# Patient Record
Sex: Female | Born: 1945 | Race: Black or African American | Hispanic: No | Marital: Married | Smoking: Never smoker
Health system: Southern US, Community
[De-identification: ages and names within clinical notes are randomized; demographics above are authoritative.]

## PROBLEM LIST (undated history)

## (undated) DIAGNOSIS — I10 Essential (primary) hypertension: Secondary | ICD-10-CM

## (undated) HISTORY — DX: Essential (primary) hypertension: I10

---

## 2015-09-16 DIAGNOSIS — I10 Essential (primary) hypertension: Secondary | ICD-10-CM | POA: Insufficient documentation

## 2015-09-16 DIAGNOSIS — H269 Unspecified cataract: Secondary | ICD-10-CM | POA: Insufficient documentation

## 2015-09-16 DIAGNOSIS — Z87820 Personal history of traumatic brain injury: Secondary | ICD-10-CM | POA: Insufficient documentation

## 2015-10-19 DIAGNOSIS — R55 Syncope and collapse: Secondary | ICD-10-CM | POA: Insufficient documentation

## 2018-04-02 DIAGNOSIS — F0280 Dementia in other diseases classified elsewhere without behavioral disturbance: Secondary | ICD-10-CM | POA: Insufficient documentation

## 2020-08-02 ENCOUNTER — Encounter: Payer: Self-pay | Admitting: Physician Assistant
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
## 2020-08-06 ENCOUNTER — Ambulatory Visit (INDEPENDENT_AMBULATORY_CARE_PROVIDER_SITE_OTHER): Payer: Medicare Other | Admitting: Physician Assistant

## 2020-08-06 ENCOUNTER — Other Ambulatory Visit: Payer: Self-pay

## 2020-08-06 ENCOUNTER — Encounter: Payer: Self-pay | Admitting: Physician Assistant

## 2020-08-06 VITALS — BP 148/82 | HR 62 | Ht 65.0 in | Wt 182.6 lb

## 2020-08-06 DIAGNOSIS — R413 Other amnesia: Secondary | ICD-10-CM

## 2020-08-06 NOTE — Patient Instructions (Addendum)
It was a pleasure to see you today at our office.   Recommendations:  Meds:  MRI brain  Check B12 and TSH   Continue Memantine 10 mg twice daily.  Continue donepezil 10 mg daily.   Physical therapy referral was done  RECOMMENDATIONS FOR ALL PATIENTS WITH MEMORY PROBLEMS: 1. Continue to exercise (Recommend 30 minutes of walking everyday, or 3 hours every week) 2. Increase social interactions - continue going to [place] and enjoy social gatherings with friends and family 3. Eat healthy, avoid fried foods and eat more fruits and vegetables 4. Maintain adequate blood pressure, blood sugar, and blood cholesterol level. Reducing the risk of stroke and cardiovascular disease also helps promoting better memory. 5. Avoid stressful situations. Live a simple life and avoid aggravations. Organize your time and prepare for the next day in anticipation. 6. Sleep well, avoid any interruptions of sleep and avoid any distractions in the bedroom that may interfere with adequate sleep quality 7. Avoid sugar, avoid sweets as there is a strong link between excessive sugar intake, diabetes, and cognitive impairment We discussed the Mediterranean diet, which has been shown to help patients reduce the risk of progressive memory disorders and reduces cardiovascular risk. This includes eating fish, eat fruits and green leafy vegetables, nuts like almonds and hazelnuts, walnuts, and also use olive oil. Avoid fast foods and fried foods as much as possible. Avoid sweets and sugar as sugar use has been linked to worsening of memory function.  There is always a concern of gradual progression of memory problems. If this is the case, then we may need to adjust level of care according to patient needs. Support, both to the patient and caregiver, should then be put into place.    The Alzheimer's Association is here all day, every day for people facing Alzheimer's disease through our free 24/7 Helpline: [phone]. The  Helpline provides reliable information and support to all those who need assistance, such as individuals living with memory loss, Alzheimer's or other dementia, caregivers, health care professionals and the public.  Our highly trained and knowledgeable staff can help you with: Understanding memory loss, dementia and Alzheimer's  Medications and other treatment options  General information about aging and brain health  Skills to provide quality care and to find the best care from professionals  Legal, financial and living-arrangement decisions Our Helpline also features: Confidential care consultation provided by master's level clinicians who can help with decision-making support, crisis assistance and education on issues families face every day  Help in a caller's preferred language using our translation service that features more than 200 languages and dialects  Referrals to local community programs, services and ongoing support     FALL PRECAUTIONS: Be cautious when walking. Scan the area for obstacles that may increase the risk of trips and falls. When getting up in the mornings, sit up at the edge of the bed for a few minutes before getting out of bed. Consider elevating the bed at the head end to avoid drop of blood pressure when getting up. Walk always in a well-lit room (use night lights in the walls). Avoid area rugs or power cords from appliances in the middle of the walkways. Use a walker or a cane if necessary and consider physical therapy for balance exercise. Get your eyesight checked regularly.  FINANCIAL OVERSIGHT: Supervision, especially oversight when making financial decisions or transactions is also recommended.  HOME SAFETY: Consider the safety of the kitchen when operating appliances like stoves, microwave oven,  and blender. Consider having supervision and share cooking responsibilities until no longer able to participate in those. Accidents with firearms and other hazards in  the house should be identified and addressed as well.   ABILITY TO BE LEFT ALONE: If patient is unable to contact 911 operator, consider using LifeLine, or when the need is there, arrange for someone to stay with patients. Smoking is a fire hazard, consider supervision or cessation. Risk of wandering should be assessed by caregiver and if detected at any point, supervision and safe proof recommendations should be instituted.  MEDICATION SUPERVISION: Inability to self-administer medication needs to be constantly addressed. Implement a mechanism to ensure safe administration of the medications.   DRIVING: Regarding driving, in patients with progressive memory problems, driving will be impaired. We advise to have someone else do the driving if trouble finding directions or if minor accidents are reported. Independent driving assessment is available to determine safety of driving.   If you are interested in the driving assessment, you can contact the following:  [employer] in [place] [phone]  [facility] [phone]  [facility] [phone] or [phone]      Mediterranean Diet A Mediterranean diet refers to food and lifestyle choices that are based on the traditions of countries located on the [employer]. This way of eating has been shown to help prevent certain conditions and improve outcomes for people who have chronic diseases, like kidney disease and heart disease. What are tips for following this plan? Lifestyle  Cook and eat meals together with your family, when possible. Drink enough fluid to keep your urine clear or pale yellow. Be physically active every day. This includes: Aerobic exercise like running or swimming. Leisure activities like gardening, walking, or housework. Get 7-8 hours of sleep each night. If recommended by your health care provider, drink red wine in moderation. This  means 1 glass a day for nonpregnant women and 2 glasses a day for men. A glass of wine equals 5 oz (150 mL). Reading food labels  Check the serving size of packaged foods. For foods such as rice and pasta, the serving size refers to the amount of cooked product, not dry. Check the total fat in packaged foods. Avoid foods that have saturated fat or trans fats. Check the ingredients list for added sugars, such as corn syrup. Shopping  At the grocery store, buy most of your food from the areas near the walls of the store. This includes: Fresh fruits and vegetables (produce). Grains, beans, nuts, and seeds. Some of these may be available in unpackaged forms or large amounts (in bulk). Fresh seafood. Poultry and eggs. Low-fat dairy products. Buy whole ingredients instead of prepackaged foods. Buy fresh fruits and vegetables in-season from local farmers markets. Buy frozen fruits and vegetables in resealable bags. If you do not have access to quality fresh seafood, buy precooked frozen shrimp or canned fish, such as tuna, salmon, or sardines. Buy small amounts of raw or cooked vegetables, salads, or olives from the deli or salad bar at your store. Stock your pantry so you always have certain foods on hand, such as olive oil, canned tuna, canned tomatoes, rice, pasta, and beans. Cooking  Cook foods with extra-virgin olive oil instead of using butter or other vegetable oils. Have meat as a side dish, and have vegetables or grains as your main dish. This means having meat in small portions or adding small amounts of meat to foods  like pasta or stew. Use beans or vegetables instead of meat in common dishes like chili or lasagna. Experiment with different cooking methods. Try roasting or broiling vegetables instead of steaming or sauteing them. Add frozen vegetables to soups, stews, pasta, or rice. Add nuts or seeds for added healthy fat at each meal. You can add these to yogurt, salads, or vegetable  dishes. Marinate fish or vegetables using olive oil, lemon juice, garlic, and fresh herbs. Meal planning  Plan to eat 1 vegetarian meal one day each week. Try to work up to 2 vegetarian meals, if possible. Eat seafood 2 or more times a week. Have healthy snacks readily available, such as: Vegetable sticks with hummus. Greek yogurt. Fruit and nut trail mix. Eat balanced meals throughout the week. This includes: Fruit: 2-3 servings a day Vegetables: 4-5 servings a day Low-fat dairy: 2 servings a day Fish, poultry, or lean meat: 1 serving a day Beans and legumes: 2 or more servings a week Nuts and seeds: 1-2 servings a day Whole grains: 6-8 servings a day Extra-virgin olive oil: 3-4 servings a day Limit red meat and sweets to only a few servings a month What are my food choices? Mediterranean diet Recommended Grains: Whole-grain pasta. Brown rice. Bulgar wheat. Polenta. Couscous. Whole-wheat bread. Orpah Cobb. Vegetables: Artichokes. Beets. Broccoli. Cabbage. Carrots. Eggplant. Green beans. Chard. Kale. Spinach. Onions. Leeks. Peas. Squash. Tomatoes. Peppers. Radishes. Fruits: Apples. Apricots. Avocado. Berries. Bananas. Cherries. Dates. Figs. Grapes. Lemons. Melon. Oranges. Peaches. Plums. Pomegranate. Meats and other protein foods: Beans. Almonds. Sunflower seeds. Pine nuts. Peanuts. Cod. Salmon. Scallops. Shrimp. Tuna. Tilapia. Clams. Oysters. Eggs. Dairy: Low-fat milk. Cheese. Greek yogurt. Beverages: Water. Red wine. Herbal tea. Fats and oils: Extra virgin olive oil. Avocado oil. Grape seed oil. Sweets and desserts: [place] yogurt with honey. Baked apples. Poached pears. Trail mix. Seasoning and other foods: Basil. Cilantro. Coriander. Cumin. Mint. Parsley. Sage. Rosemary. Tarragon. Garlic. Oregano. Thyme. Pepper. Balsalmic vinegar. Tahini. Hummus. Tomato sauce. Olives. Mushrooms. Limit these Grains: Prepackaged pasta or rice dishes. Prepackaged cereal with added  sugar. Vegetables: Deep fried potatoes (french fries). Fruits: Fruit canned in syrup. Meats and other protein foods: Beef. Pork. Lamb. Poultry with skin. Hot dogs. Tomasa Blase. Dairy: Ice cream. Sour cream. Whole milk. Beverages: Juice. Sugar-sweetened soft drinks. Beer. Liquor and spirits. Fats and oils: Butter. Canola oil. Vegetable oil. Beef fat (tallow). Lard. Sweets and desserts: Cookies. Cakes. Pies. Candy. Seasoning and other foods: Mayonnaise. Premade sauces and marinades. The items listed may not be a complete list. Talk with your dietitian about what dietary choices are right for you. Summary The Mediterranean diet includes both food and lifestyle choices. Eat a variety of fresh fruits and vegetables, beans, nuts, seeds, and whole grains. Limit the amount of red meat and sweets that you eat. Talk with your health care provider about whether it is safe for you to drink red wine in moderation. This means 1 glass a day for nonpregnant women and 2 glasses a day for men. A glass of wine equals 5 oz (150 mL). This information is not intended to replace advice given to you by your health care provider. Make sure you discuss any questions you have with your health care provider. Document Released: 08/26/2015 Document Revised: 09/28/2015 Document Reviewed: 08/26/2015 Elsevier Interactive Patient Education  2017 [employer].

## 2020-08-13 ENCOUNTER — Other Ambulatory Visit: Payer: Self-pay

## 2020-08-13 DIAGNOSIS — Z87820 Personal history of traumatic brain injury: Secondary | ICD-10-CM

## 2020-08-13 DIAGNOSIS — R413 Other amnesia: Secondary | ICD-10-CM

## 2020-08-23 ENCOUNTER — Other Ambulatory Visit: Payer: Self-pay

## 2020-08-23 ENCOUNTER — Ambulatory Visit
Admission: RE | Admit: 2020-08-23 | Discharge: 2020-08-23 | Disposition: A | Discharge status: Home / Self Care | Payer: Medicare Other | Source: Ambulatory Visit | Attending: Physician Assistant | Admitting: Physician Assistant

## 2020-08-23 DIAGNOSIS — R413 Other amnesia: Secondary | ICD-10-CM

## 2020-08-23 MED ORDER — GADOBENATE DIMEGLUMINE 529 MG/ML IV SOLN
17.0000 mL | Freq: Once | INTRAVENOUS | Status: AC | PRN
  Administered 2020-08-23: 17 mL via INTRAVENOUS

## 2020-08-24 NOTE — Progress Notes (Signed)
Advised patient and voiced understanding of MRI results.

## 2020-10-06 ENCOUNTER — Other Ambulatory Visit: Payer: Self-pay

## 2020-10-06 ENCOUNTER — Ambulatory Visit: Payer: Medicare HMO | Admitting: Physician Assistant

## 2020-10-06 ENCOUNTER — Telehealth (HOSPITAL_COMMUNITY): Payer: Self-pay

## 2020-10-06 ENCOUNTER — Encounter: Payer: Self-pay | Admitting: Physician Assistant

## 2020-10-06 VITALS — BP 150/82 | HR 99 | Ht 65.0 in | Wt 181.0 lb

## 2020-10-06 DIAGNOSIS — F0391 Unspecified dementia with behavioral disturbance: Secondary | ICD-10-CM | POA: Diagnosis not present

## 2020-10-06 DIAGNOSIS — R413 Other amnesia: Secondary | ICD-10-CM

## 2020-10-06 DIAGNOSIS — Z87820 Personal history of traumatic brain injury: Secondary | ICD-10-CM | POA: Diagnosis not present

## 2020-10-06 DIAGNOSIS — F03B18 Unspecified dementia, moderate, with other behavioral disturbance: Secondary | ICD-10-CM | POA: Insufficient documentation

## 2020-10-06 NOTE — Patient Instructions (Addendum)
It was a pleasure to see you today at our office.   Recommendations:  Meds:  Discussed safety both in and out of the home. Recommend 24/7 care  Discussed the importance of regular daily schedule  Continue to monitor mood   Recommend increase Zoloft 150 mg daily, Xanax 0.25 mg bid prn Continue donepezil 10 mg daily.   Continue Memantine 10 mg twice daily.  Stay active at least 30 minutes at least 3 times a week. Recommend PT  for strength and balance  Referral to swallow evaluation  Naps should be scheduled and should be no longer than 60 minutes and should not occur after 2 PM.  Mediterranean diet is recommended  Follow up in 3 months. 12/21 at 11 am     RECOMMENDATIONS FOR ALL PATIENTS WITH MEMORY PROBLEMS: 1. Continue to exercise (Recommend 30 minutes of walking everyday, or 3 hours every week) 2. Increase social interactions - continue going to [place] and enjoy social gatherings with friends and family 3. Eat healthy, avoid fried foods and eat more fruits and vegetables 4. Maintain adequate blood pressure, blood sugar, and blood cholesterol level. Reducing the risk of stroke and cardiovascular disease also helps promoting better memory. 5. Avoid stressful situations. Live a simple life and avoid aggravations. Organize your time and prepare for the next day in anticipation. 6. Sleep well, avoid any interruptions of sleep and avoid any distractions in the bedroom that may interfere with adequate sleep quality 7. Avoid sugar, avoid sweets as there is a strong link between excessive sugar intake, diabetes, and cognitive impairment We discussed the Mediterranean diet, which has been shown to help patients reduce the risk of progressive memory disorders and reduces cardiovascular risk. This includes eating fish, eat fruits and green leafy vegetables, nuts like almonds and hazelnuts, walnuts, and also use olive oil. Avoid fast foods and fried foods as much as possible. Avoid sweets and sugar as  sugar use has been linked to worsening of memory function.  There is always a concern of gradual progression of memory problems. If this is the case, then we may need to adjust level of care according to patient needs. Support, both to the patient and caregiver, should then be put into place.    The Alzheimer's Association is here all day, every day for people facing Alzheimer's disease through our free 24/7 Helpline: (215) 541-9636. The Helpline provides reliable information and support to all those who need assistance, such as individuals living with memory loss, Alzheimer's or other dementia, caregivers, health care professionals and the public.  Our highly trained and knowledgeable staff can help you with: Understanding memory loss, dementia and Alzheimer's  Medications and other treatment options  General information about aging and brain health  Skills to provide quality care and to find the best care from professionals  Legal, financial and living-arrangement decisions Our Helpline also features: Confidential care consultation provided by master's level clinicians who can help with decision-making support, crisis assistance and education on issues families face every day  Help in a caller's preferred language using our translation service that features more than 200 languages and dialects  Referrals to local community programs, services and ongoing support     FALL PRECAUTIONS: Be cautious when walking. Scan the area for obstacles that may increase the risk of trips and falls. When getting up in the mornings, sit up at the edge of the bed for a few minutes before getting out of bed. Consider elevating the bed at the head end  to avoid drop of blood pressure when getting up. Walk always in a well-lit room (use night lights in the walls). Avoid area rugs or power cords from appliances in the middle of the walkways. Use a walker or a cane if necessary and consider physical therapy for balance  exercise. Get your eyesight checked regularly.  FINANCIAL OVERSIGHT: Supervision, especially oversight when making financial decisions or transactions is also recommended.  HOME SAFETY: Consider the safety of the kitchen when operating appliances like stoves, microwave oven, and blender. Consider having supervision and share cooking responsibilities until no longer able to participate in those. Accidents with firearms and other hazards in the house should be identified and addressed as well.   ABILITY TO BE LEFT ALONE: If patient is unable to contact 911 operator, consider using LifeLine, or when the need is there, arrange for someone to stay with patients. Smoking is a fire hazard, consider supervision or cessation. Risk of wandering should be assessed by caregiver and if detected at any point, supervision and safe proof recommendations should be instituted.  MEDICATION SUPERVISION: Inability to self-administer medication needs to be constantly addressed. Implement a mechanism to ensure safe administration of the medications.           Mediterranean Diet A Mediterranean diet refers to food and lifestyle choices that are based on the traditions of countries located on the [employer] [place]. This way of eating has been shown to help prevent certain conditions and improve outcomes for people who have chronic diseases, like kidney disease and heart disease. What are tips for following this plan? Lifestyle  Cook and eat meals together with your family, when possible. Drink enough fluid to keep your urine clear or pale yellow. Be physically active every day. This includes: Aerobic exercise like running or swimming. Leisure activities like gardening, walking, or housework. Get 7-8 hours of sleep each night. If recommended by your health care provider, drink red wine in moderation. This means 1 glass a day for nonpregnant women and 2 glasses a day for men. A glass of wine equals 5 oz (150  mL). Reading food labels  Check the serving size of packaged foods. For foods such as rice and pasta, the serving size refers to the amount of cooked product, not dry. Check the total fat in packaged foods. Avoid foods that have saturated fat or trans fats. Check the ingredients list for added sugars, such as corn syrup. Shopping  At the grocery store, buy most of your food from the areas near the walls of the store. This includes: Fresh fruits and vegetables (produce). Grains, beans, nuts, and seeds. Some of these may be available in unpackaged forms or large amounts (in bulk). Fresh seafood. Poultry and eggs. Low-fat dairy products. Buy whole ingredients instead of prepackaged foods. Buy fresh fruits and vegetables in-season from local farmers markets. Buy frozen fruits and vegetables in resealable bags. If you do not have access to quality fresh seafood, buy precooked frozen shrimp or canned fish, such as tuna, salmon, or sardines. Buy small amounts of raw or cooked vegetables, salads, or olives from the deli or salad bar at your store. Stock your pantry so you always have certain foods on hand, such as olive oil, canned tuna, canned tomatoes, rice, pasta, and beans. Cooking  Cook foods with extra-virgin olive oil instead of using butter or other vegetable oils. Have meat as a side dish, and have vegetables or grains as your main dish. This means having meat in small portions  or adding small amounts of meat to foods like pasta or stew. Use beans or vegetables instead of meat in common dishes like chili or lasagna. Experiment with different cooking methods. Try roasting or broiling vegetables instead of steaming or sauteing them. Add frozen vegetables to soups, stews, pasta, or rice. Add nuts or seeds for added healthy fat at each meal. You can add these to yogurt, salads, or vegetable dishes. Marinate fish or vegetables using olive oil, lemon juice, garlic, and fresh herbs. Meal  planning  Plan to eat 1 vegetarian meal one day each week. Try to work up to 2 vegetarian meals, if possible. Eat seafood 2 or more times a week. Have healthy snacks readily available, such as: Vegetable sticks with hummus. Greek yogurt. Fruit and nut trail mix. Eat balanced meals throughout the week. This includes: Fruit: 2-3 servings a day Vegetables: 4-5 servings a day Low-fat dairy: 2 servings a day Fish, poultry, or lean meat: 1 serving a day Beans and legumes: 2 or more servings a week Nuts and seeds: 1-2 servings a day Whole grains: 6-8 servings a day Extra-virgin olive oil: 3-4 servings a day Limit red meat and sweets to only a few servings a month What are my food choices? Mediterranean diet Recommended Grains: Whole-grain pasta. Brown rice. Bulgar wheat. Polenta. Couscous. Whole-wheat bread. Orpah Cobb. Vegetables: Artichokes. Beets. Broccoli. Cabbage. Carrots. Eggplant. Green beans. Chard. Kale. Spinach. Onions. Leeks. Peas. Squash. Tomatoes. Peppers. Radishes. Fruits: Apples. Apricots. Avocado. Berries. Bananas. Cherries. Dates. Figs. Grapes. Lemons. Melon. Oranges. Peaches. Plums. Pomegranate. Meats and other protein foods: Beans. Almonds. Sunflower seeds. Pine nuts. Peanuts. Cod. Salmon. Scallops. Shrimp. Tuna. Tilapia. Clams. Oysters. Eggs. Dairy: Low-fat milk. Cheese. Greek yogurt. Beverages: Water. Red wine. Herbal tea. Fats and oils: Extra virgin olive oil. Avocado oil. Grape seed oil. Sweets and desserts: [place] yogurt with honey. Baked apples. Poached pears. Trail mix. Seasoning and other foods: Basil. Cilantro. Coriander. Cumin. Mint. Parsley. Sage. Rosemary. Tarragon. Garlic. Oregano. Thyme. Pepper. Balsalmic vinegar. Tahini. Hummus. Tomato sauce. Olives. Mushrooms. Limit these Grains: Prepackaged pasta or rice dishes. Prepackaged cereal with added sugar. Vegetables: Deep fried potatoes (french fries). Fruits: Fruit canned in syrup. Meats and other protein  foods: Beef. Pork. Lamb. Poultry with skin. Hot dogs. Tomasa Blase. Dairy: Ice cream. Sour cream. Whole milk. Beverages: Juice. Sugar-sweetened soft drinks. Beer. Liquor and spirits. Fats and oils: Butter. Canola oil. Vegetable oil. Beef fat (tallow). Lard. Sweets and desserts: Cookies. Cakes. Pies. Candy. Seasoning and other foods: Mayonnaise. Premade sauces and marinades. The items listed may not be a complete list. Talk with your dietitian about what dietary choices are right for you. Summary The Mediterranean diet includes both food and lifestyle choices. Eat a variety of fresh fruits and vegetables, beans, nuts, seeds, and whole grains. Limit the amount of red meat and sweets that you eat. Talk with your health care provider about whether it is safe for you to drink red wine in moderation. This means 1 glass a day for nonpregnant women and 2 glasses a day for men. A glass of wine equals 5 oz (150 mL). This information is not intended to replace advice given to you by your health care provider. Make sure you discuss any questions you have with your health care provider. Document Released: 08/26/2015 Document Revised: 09/28/2015 Document Reviewed: 08/26/2015 Elsevier Interactive Patient Education  2017 [employer].
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
## 2020-10-06 NOTE — Telephone Encounter (Signed)
Attempted to contact patient to schedule OP MBS - left voicemail. ?

## 2020-10-07 ENCOUNTER — Ambulatory Visit: Payer: Medicare Other | Admitting: Physician Assistant

## 2020-10-11 ENCOUNTER — Other Ambulatory Visit (HOSPITAL_COMMUNITY): Payer: Self-pay | Admitting: *Deleted

## 2020-10-11 DIAGNOSIS — R1310 Dysphagia, unspecified: Secondary | ICD-10-CM

## 2020-10-25 ENCOUNTER — Ambulatory Visit (HOSPITAL_COMMUNITY)
Admission: RE | Admit: 2020-10-25 | Discharge: 2020-10-25 | Disposition: A | Discharge status: Home / Self Care | Payer: Medicare HMO | Source: Ambulatory Visit | Attending: Family Medicine | Admitting: Family Medicine

## 2020-10-25 ENCOUNTER — Other Ambulatory Visit: Payer: Self-pay

## 2020-10-25 DIAGNOSIS — F0390 Unspecified dementia without behavioral disturbance: Secondary | ICD-10-CM | POA: Insufficient documentation

## 2020-10-25 DIAGNOSIS — R413 Other amnesia: Secondary | ICD-10-CM | POA: Insufficient documentation

## 2020-10-25 DIAGNOSIS — R1310 Dysphagia, unspecified: Secondary | ICD-10-CM | POA: Insufficient documentation

## 2020-10-25 DIAGNOSIS — K117 Disturbances of salivary secretion: Secondary | ICD-10-CM | POA: Insufficient documentation

## 2020-10-25 DIAGNOSIS — Z87820 Personal history of traumatic brain injury: Secondary | ICD-10-CM | POA: Insufficient documentation

## 2020-10-25 NOTE — Progress Notes (Signed)
Modified Barium Swallow Progress Note  Patient Details  Name: Shelly Morris MRN: [id number] Date of Birth: 16-Mar-1945  Today's Date: 10/25/2020  Modified Barium Swallow completed.  Full report located under Chart Review in the Imaging Section.  Brief recommendations include the following:  Clinical Impression  Pt demonstrates adequate swallow function. She did have moderate oral holding with encouragement needed to initiate swallows of barium which she found disgusting.  Pt able to masticate and swallow and husband denies any oral dysphagia at home except with pills at times.  Behavior does suggest potential for oral holding or pocketing in the future given dx of dementia. Review the possiblity of this in the future with husband. At this time no modification or interventions needed.   Swallow Evaluation Recommendations       SLP Diet Recommendations: Regular solids;Thin liquid   Liquid Administration via: Cup;Straw                            Clarice Zulauf, Riley Nearing 10/25/2020,1:41 PM

## 2020-10-28 ENCOUNTER — Telehealth: Payer: Self-pay | Admitting: Physician Assistant

## 2020-10-28 DIAGNOSIS — Z87820 Personal history of traumatic brain injury: Secondary | ICD-10-CM

## 2020-10-28 NOTE — Telephone Encounter (Signed)
Patient's daughter called and requested the patient's referral for rehab be sent to the facility below where she has been seen before.  [facility] [employer] [phone]

## 2020-10-28 NOTE — Telephone Encounter (Signed)
Referral sent to [facility] [employer] [phone]

## 2021-10-13 ENCOUNTER — Telehealth: Payer: Self-pay | Admitting: Physician Assistant

## 2021-10-13 NOTE — Telephone Encounter (Signed)
Patient's daughter Levada Dy called said patient's PCP recommended follow up with Sharene Butters, PA sooner than scheduled in November.  Patient is continuing to have problems with picking things up and eating.

## 2021-10-20 ENCOUNTER — Ambulatory Visit: Payer: Medicare HMO | Admitting: Physician Assistant

## 2021-10-20 ENCOUNTER — Encounter: Payer: Self-pay | Admitting: Physician Assistant

## 2021-10-20 VITALS — BP 113/71 | Resp 18 | Ht 62.0 in | Wt 145.0 lb

## 2021-10-20 DIAGNOSIS — F03B18 Unspecified dementia, moderate, with other behavioral disturbance: Secondary | ICD-10-CM
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
## 2021-10-20 NOTE — Patient Instructions (Signed)
It was a pleasure to see you today at our office.   Recommendations:  Meds:  Discussed safety both in and out of the home. Recommend 24/7 care  Continue to monitor mood    Continue Zoloft 150 mg daily  Follow up in 6 months  COntiue PT/OT and ST      RECOMMENDATIONS FOR ALL PATIENTS WITH MEMORY PROBLEMS: 1. Continue to exercise (Recommend 30 minutes of walking everyday, or 3 hours every week) 2. Increase social interactions - continue going to [place] and enjoy social gatherings with friends and family 3. Eat healthy, avoid fried foods and eat more fruits and vegetables 4. Maintain adequate blood pressure, blood sugar, and blood cholesterol level. Reducing the risk of stroke and cardiovascular disease also helps promoting better memory. 5. Avoid stressful situations. Live a simple life and avoid aggravations. Organize your time and prepare for the next day in anticipation. 6. Sleep well, avoid any interruptions of sleep and avoid any distractions in the bedroom that may interfere with adequate sleep quality 7. Avoid sugar, avoid sweets as there is a strong link between excessive sugar intake, diabetes, and cognitive impairment We discussed the Mediterranean diet, which has been shown to help patients reduce the risk of progressive memory disorders and reduces cardiovascular risk. This includes eating fish, eat fruits and green leafy vegetables, nuts like almonds and hazelnuts, walnuts, and also use olive oil. Avoid fast foods and fried foods as much as possible. Avoid sweets and sugar as sugar use has been linked to worsening of memory function.  There is always a concern of gradual progression of memory problems. If this is the case, then we may need to adjust level of care according to patient needs. Support, both to the patient and caregiver, should then be put into place.    The Alzheimer's Association is here all day, every day for people facing Alzheimer's disease through our free 24/7  Helpline: (626)247-2141. The Helpline provides reliable information and support to all those who need assistance, such as individuals living with memory loss, Alzheimer's or other dementia, caregivers, health care professionals and the public.  Our highly trained and knowledgeable staff can help you with: Understanding memory loss, dementia and Alzheimer's  Medications and other treatment options  General information about aging and brain health  Skills to provide quality care and to find the best care from professionals  Legal, financial and living-arrangement decisions Our Helpline also features: Confidential care consultation provided by master's level clinicians who can help with decision-making support, crisis assistance and education on issues families face every day  Help in a caller's preferred language using our translation service that features more than 200 languages and dialects  Referrals to local community programs, services and ongoing support     FALL PRECAUTIONS: Be cautious when walking. Scan the area for obstacles that may increase the risk of trips and falls. When getting up in the mornings, sit up at the edge of the bed for a few minutes before getting out of bed. Consider elevating the bed at the head end to avoid drop of blood pressure when getting up. Walk always in a well-lit room (use night lights in the walls). Avoid area rugs or power cords from appliances in the middle of the walkways. Use a walker or a cane if necessary and consider physical therapy for balance exercise. Get your eyesight checked regularly.  FINANCIAL OVERSIGHT: Supervision, especially oversight when making financial decisions or transactions is also recommended.  HOME SAFETY: Consider  the safety of the kitchen when operating appliances like stoves, microwave oven, and blender. Consider having supervision and share cooking responsibilities until no longer able to participate in those. Accidents with  firearms and other hazards in the house should be identified and addressed as well.   ABILITY TO BE LEFT ALONE: If patient is unable to contact 911 operator, consider using LifeLine, or when the need is there, arrange for someone to stay with patients. Smoking is a fire hazard, consider supervision or cessation. Risk of wandering should be assessed by caregiver and if detected at any point, supervision and safe proof recommendations should be instituted.  MEDICATION SUPERVISION: Inability to self-administer medication needs to be constantly addressed. Implement a mechanism to ensure safe administration of the medications.           Mediterranean Diet A Mediterranean diet refers to food and lifestyle choices that are based on the traditions of countries located on the [employer]. This way of eating has been shown to help prevent certain conditions and improve outcomes for people who have chronic diseases, like kidney disease and heart disease. What are tips for following this plan? Lifestyle  Cook and eat meals together with your family, when possible. Drink enough fluid to keep your urine clear or pale yellow. Be physically active every day. This includes: Aerobic exercise like running or swimming. Leisure activities like gardening, walking, or housework. Get 7-8 hours of sleep each night. If recommended by your health care provider, drink red wine in moderation. This means 1 glass a day for nonpregnant women and 2 glasses a day for men. A glass of wine equals 5 oz (150 mL). Reading food labels  Check the serving size of packaged foods. For foods such as rice and pasta, the serving size refers to the amount of cooked product, not dry. Check the total fat in packaged foods. Avoid foods that have saturated fat or trans fats. Check the ingredients list for added sugars, such as corn syrup. Shopping  At the grocery store, buy most of your food from the areas near the walls of the  store. This includes: Fresh fruits and vegetables (produce). Grains, beans, nuts, and seeds. Some of these may be available in unpackaged forms or large amounts (in bulk). Fresh seafood. Poultry and eggs. Low-fat dairy products. Buy whole ingredients instead of prepackaged foods. Buy fresh fruits and vegetables in-season from local farmers markets. Buy frozen fruits and vegetables in resealable bags. If you do not have access to quality fresh seafood, buy precooked frozen shrimp or canned fish, such as tuna, salmon, or sardines. Buy small amounts of raw or cooked vegetables, salads, or olives from the deli or salad bar at your store. Stock your pantry so you always have certain foods on hand, such as olive oil, canned tuna, canned tomatoes, rice, pasta, and beans. Cooking  Cook foods with extra-virgin olive oil instead of using butter or other vegetable oils. Have meat as a side dish, and have vegetables or grains as your main dish. This means having meat in small portions or adding small amounts of meat to foods like pasta or stew. Use beans or vegetables instead of meat in common dishes like chili or lasagna. Experiment with different cooking methods. Try roasting or broiling vegetables instead of steaming or sauteing them. Add frozen vegetables to soups, stews, pasta, or rice. Add nuts or seeds for added healthy fat at each meal. You can add these to yogurt, salads, or vegetable dishes. Marinate fish  or vegetables using olive oil, lemon juice, garlic, and fresh herbs. Meal planning  Plan to eat 1 vegetarian meal one day each week. Try to work up to 2 vegetarian meals, if possible. Eat seafood 2 or more times a week. Have healthy snacks readily available, such as: Vegetable sticks with hummus. Greek yogurt. Fruit and nut trail mix. Eat balanced meals throughout the week. This includes: Fruit: 2-3 servings a day Vegetables: 4-5 servings a day Low-fat dairy: 2 servings a day Fish,  poultry, or lean meat: 1 serving a day Beans and legumes: 2 or more servings a week Nuts and seeds: 1-2 servings a day Whole grains: 6-8 servings a day Extra-virgin olive oil: 3-4 servings a day Limit red meat and sweets to only a few servings a month What are my food choices? Mediterranean diet Recommended Grains: Whole-grain pasta. Brown rice. Bulgar wheat. Polenta. Couscous. Whole-wheat bread. [place]. Vegetables: Artichokes. Beets. Broccoli. Cabbage. Carrots. Eggplant. Green beans. Chard. Kale. Spinach. Onions. Leeks. Peas. Squash. Tomatoes. Peppers. Radishes. Fruits: Apples. Apricots. Avocado. Berries. Bananas. Cherries. Dates. Figs. Grapes. Lemons. Melon. Oranges. Peaches. Plums. Pomegranate. Meats and other protein foods: Beans. Almonds. Sunflower seeds. Pine nuts. Peanuts. [place]. Salmon. Scallops. Shrimp. [place]. Tilapia. Clams. Oysters. Eggs. Dairy: Low-fat milk. Cheese. Greek yogurt. Beverages: Water. Red wine. Herbal tea. Fats and oils: Extra virgin olive oil. Avocado oil. Grape seed oil. Sweets and desserts: [place] yogurt with honey. Baked apples. Poached pears. Trail mix. Seasoning and other foods: Basil. Cilantro. Coriander. Cumin. Mint. Parsley. Sage. Rosemary. Tarragon. Garlic. Oregano. Thyme. Pepper. Balsalmic vinegar. Tahini. Hummus. Tomato sauce. Olives. Mushrooms. Limit these Grains: Prepackaged pasta or rice dishes. Prepackaged cereal with added sugar. Vegetables: Deep fried potatoes (french fries). Fruits: Fruit canned in syrup. Meats and other protein foods: Beef. Pork. Lamb. Poultry with skin. Hot dogs. [place] Salines. Dairy: Ice cream. Sour cream. Whole milk. Beverages: Juice. Sugar-sweetened soft drinks. Beer. Liquor and spirits. Fats and oils: Butter. Canola oil. Vegetable oil. Beef fat (tallow). Lard. Sweets and desserts: Cookies. Cakes. Pies. Candy. Seasoning and other foods: Mayonnaise. Premade sauces and marinades. The items listed may not be a complete list. Talk  with your dietitian about what dietary choices are right for you. Summary The Mediterranean diet includes both food and lifestyle choices. Eat a variety of fresh fruits and vegetables, beans, nuts, seeds, and whole grains. Limit the amount of red meat and sweets that you eat. Talk with your health care provider about whether it is safe for you to drink red wine in moderation. This means 1 glass a day for nonpregnant women and 2 glasses a day for men. A glass of wine equals 5 oz (150 mL). This information is not intended to replace advice given to you by your health care provider. Make sure you discuss any questions you have with your health care provider. Document Released: 08/26/2015 Document Revised: 09/28/2015 Document Reviewed: 08/26/2015 Elsevier Interactive Patient Education  2017 [employer].

## 2021-10-24 ENCOUNTER — Other Ambulatory Visit: Payer: Self-pay

## 2021-10-24 ENCOUNTER — Telehealth: Payer: Self-pay | Admitting: Anesthesiology

## 2021-10-24 MED ORDER — DONEPEZIL HCL 10 MG PO TABS: 10.0000 mg | ORAL_TABLET | Freq: Every day | ORAL | 1 refills | Status: DC

## 2021-10-24 NOTE — Telephone Encounter (Signed)
Sent new rx to [facility] via [facility]

## 2021-10-24 NOTE — Telephone Encounter (Signed)
Patient's daughter Shelly Morris called to request a refill on her mom's rx  Aricept 10 mg. Prescription goes to [facility] at [phone].

## 2021-11-16 ENCOUNTER — Ambulatory Visit: Payer: Medicare HMO | Admitting: Physician Assistant

## 2021-12-16 DEATH — deceased

## 2022-03-16 ENCOUNTER — Encounter: Payer: Self-pay | Admitting: Physician Assistant

## 2022-04-24 ENCOUNTER — Ambulatory Visit: Payer: Medicare HMO | Admitting: Physician Assistant

## 2022-05-04 ENCOUNTER — Ambulatory Visit: Payer: Medicare HMO | Admitting: Physician Assistant

## 2022-09-05 IMAGING — RF DG SWALLOWING FUNCTION
12 of 19 series · 12 of 24 positions shown · non-contrast
Comparison: None.

CLINICAL DATA: Dysphagia. Cough/GE reflux disease/other secondary
diagnosis

EXAM:
MODIFIED BARIUM SWALLOW
TECHNIQUE: Different consistencies of barium were administered orally to the
patient by the Speech Pathologist. Imaging of the pharynx was
performed in the lateral projection. The radiologist was present in
the fluoroscopy room for this study, providing personal supervision.
FLUOROSCOPY TIME:  Fluoroscopy Time:  2 minutes 15 seconds
Radiation Exposure Index (if provided by the fluoroscopic device):
Number of Acquired Spot Images: 0

[Series 1: run · 1 of 44 frames shown (1 of 12)]
[frame 38/44]
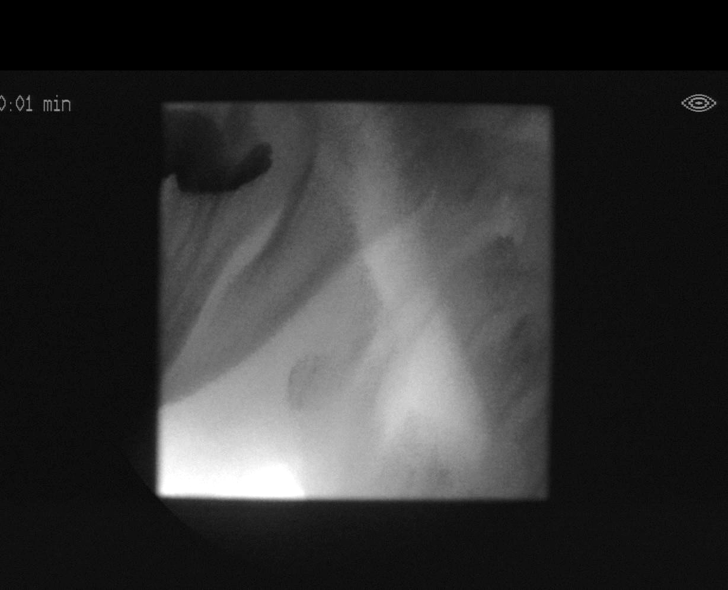

[Series 3: run · 1 of 75 frames shown (2 of 12)]
[frame 38/75]
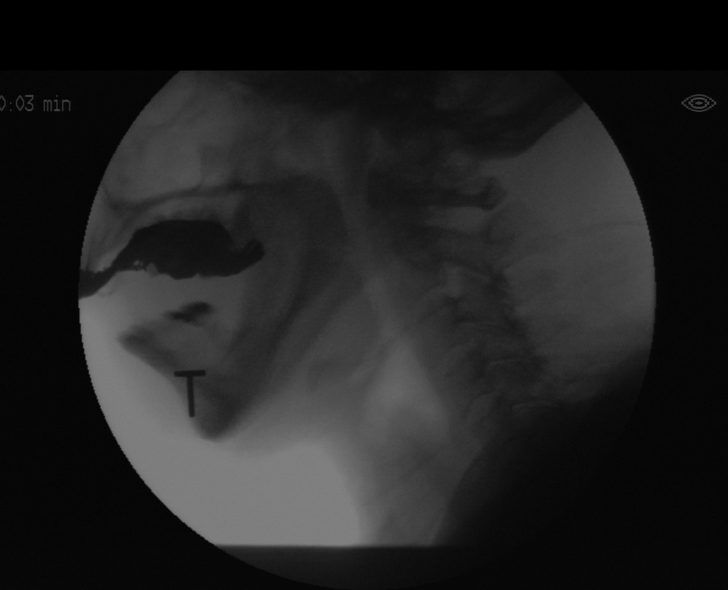

[Series 5: run · 1 of 435 frames shown (3 of 12)]
[frame 66/435]
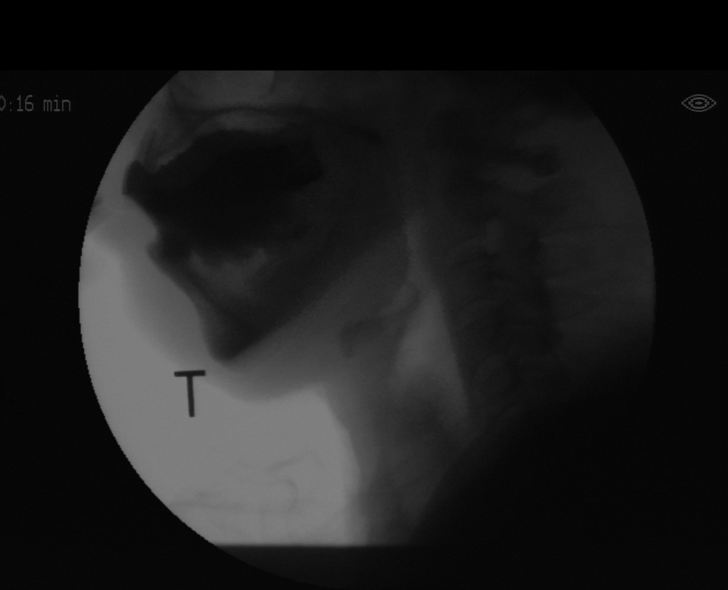

[Series 6: run · 1 of 261 frames shown (4 of 12)]
[frame 222/261]
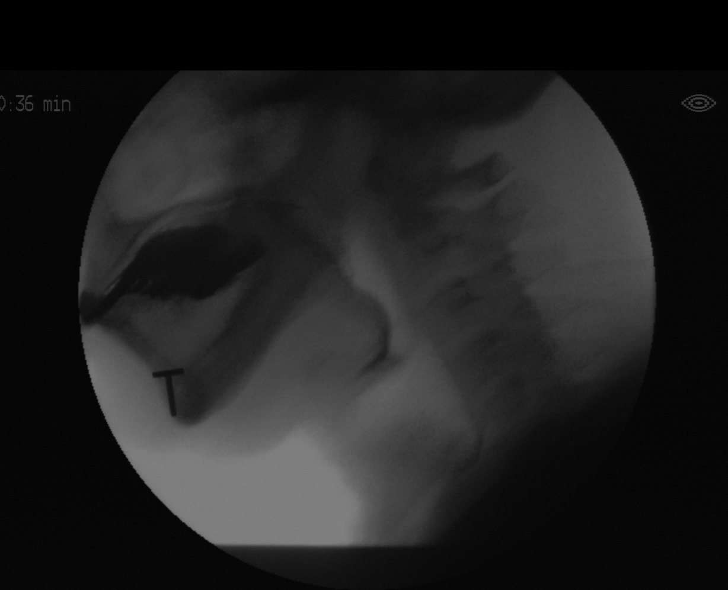

[Series 8: run · 1 of 241 frames shown (5 of 12)]
[frame 121/241]
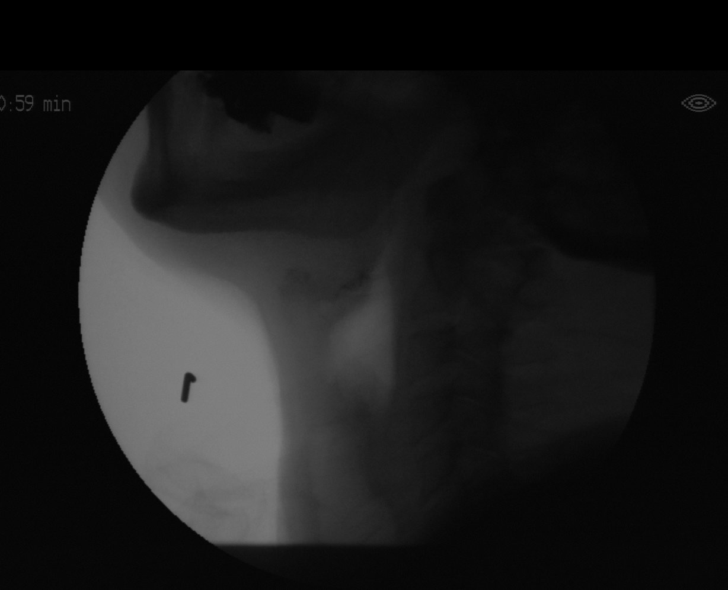

[Series 10: run · 1 of 298 frames shown (6 of 12)]
[frame 45/298]
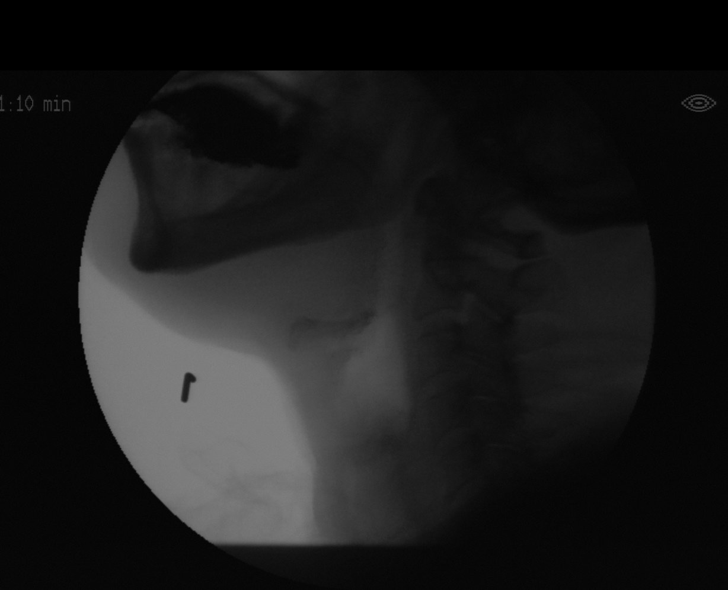

[Series 11: run · 1 of 235 frames shown (7 of 12)]
[frame 118/235]
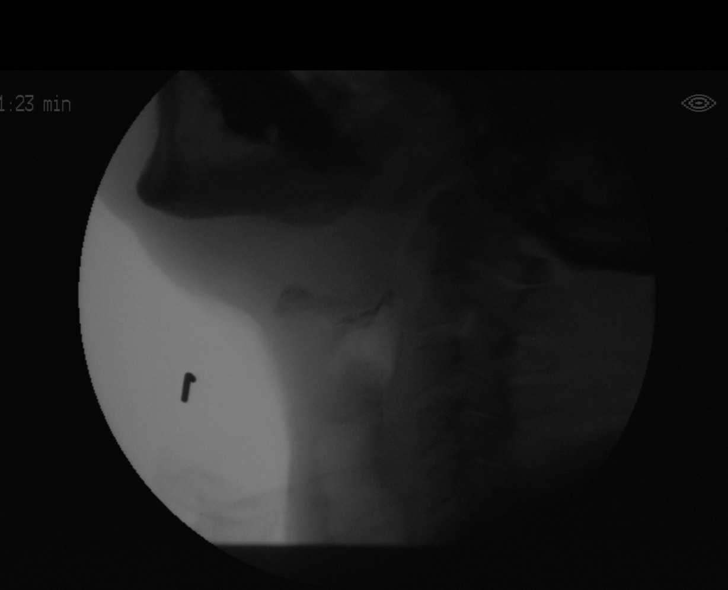

[Series 13: run · 1 of 91 frames shown (8 of 12)]
[frame 14/91]
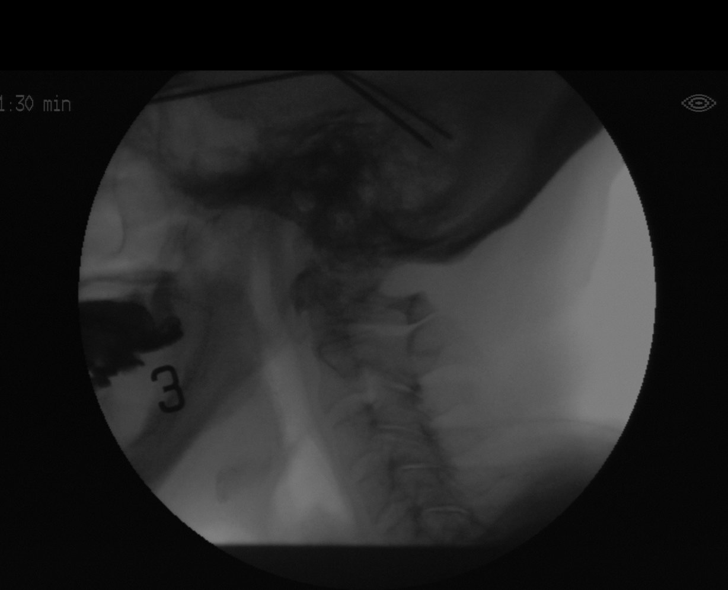

[Series 14: run · 1 of 97 frames shown (9 of 12)]
[frame 97/97]
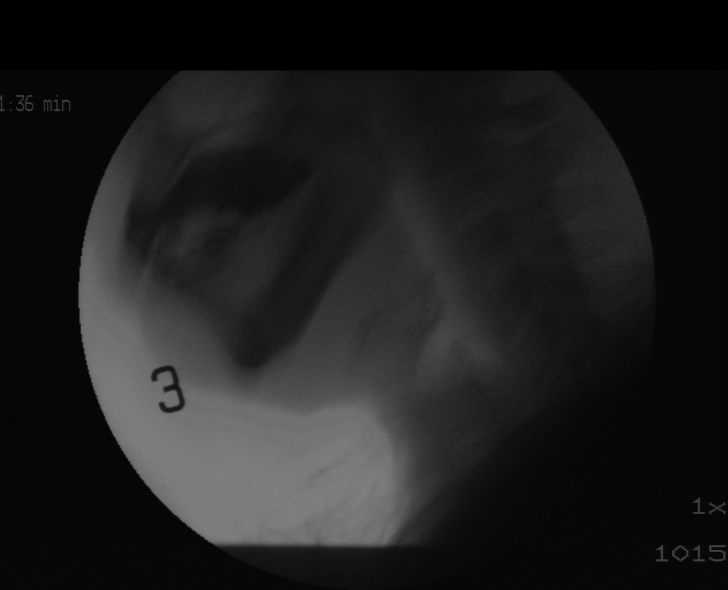

[Series 16: run · 1 of 94 frames shown (10 of 12)]
[frame 78/94]
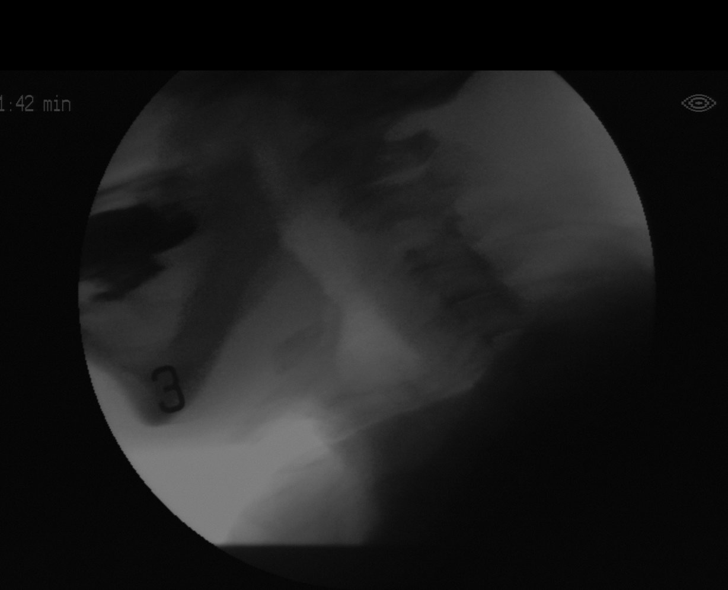

[Series 18: run · 1 of 149 frames shown (11 of 12)]
[frame 2/149]
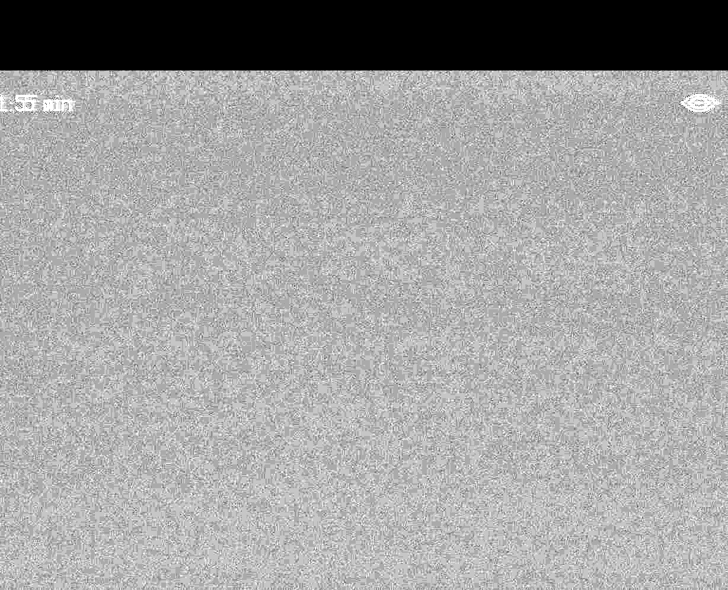

[Series 19: run · 1 of 448 frames shown (12 of 12)]
[frame 381/448]
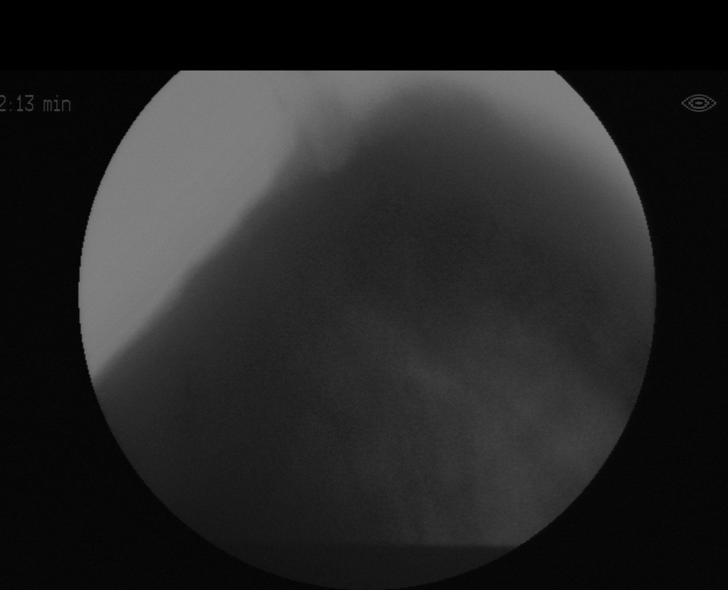

[12 of 24 positions shown; findings below may reference images not displayed]

FINDINGS: No abnormal findings with thin liquids, puree, puree with cracker or
solid consistencies.
IMPRESSION: Normal study.

Please refer to the Speech Pathologists report for complete details
and recommendations.
